# Patient Record
Sex: Female | Born: 1965 | Smoking: Never smoker
Health system: Southern US, Community
[De-identification: ages and names within clinical notes are randomized; demographics above are authoritative.]

## PROBLEM LIST (undated history)

## (undated) DIAGNOSIS — I471 Supraventricular tachycardia, unspecified: Secondary | ICD-10-CM

## (undated) DIAGNOSIS — T7840XA Allergy, unspecified, initial encounter: Secondary | ICD-10-CM

## (undated) DIAGNOSIS — F988 Other specified behavioral and emotional disorders with onset usually occurring in childhood and adolescence: Secondary | ICD-10-CM

## (undated) DIAGNOSIS — I10 Essential (primary) hypertension: Secondary | ICD-10-CM

## (undated) DIAGNOSIS — R002 Palpitations: Secondary | ICD-10-CM

## (undated) DIAGNOSIS — E7800 Pure hypercholesterolemia, unspecified: Secondary | ICD-10-CM

## (undated) DIAGNOSIS — F439 Reaction to severe stress, unspecified: Secondary | ICD-10-CM

## (undated) HISTORY — DX: Essential (primary) hypertension: I10

## (undated) HISTORY — DX: Other specified behavioral and emotional disorders with onset usually occurring in childhood and adolescence: F98.8

## (undated) HISTORY — DX: Reaction to severe stress, unspecified: F43.9

## (undated) HISTORY — DX: Supraventricular tachycardia: I47.1

## (undated) HISTORY — DX: Pure hypercholesterolemia, unspecified: E78.00

## (undated) HISTORY — PX: BREAST BIOPSY: SHX20

## (undated) HISTORY — DX: Palpitations: R00.2

## (undated) HISTORY — DX: Allergy, unspecified, initial encounter: T78.40XA

## (undated) HISTORY — DX: Supraventricular tachycardia, unspecified: I47.10

## (undated) HISTORY — PX: BREAST EXCISIONAL BIOPSY: SUR124

---

## 2014-04-21 ENCOUNTER — Other Ambulatory Visit (HOSPITAL_COMMUNITY)
Admission: RE | Admit: 2014-04-21 | Discharge: 2014-04-21 | Disposition: A | Discharge status: Home / Self Care | Payer: BC Managed Care – PPO | Source: Ambulatory Visit | Attending: Family Medicine | Admitting: Family Medicine

## 2014-04-21 DIAGNOSIS — Z01419 Encounter for gynecological examination (general) (routine) without abnormal findings: Secondary | ICD-10-CM | POA: Insufficient documentation

## 2015-12-02 ENCOUNTER — Other Ambulatory Visit: Payer: Self-pay | Admitting: Family Medicine

## 2015-12-02 DIAGNOSIS — Z1231 Encounter for screening mammogram for malignant neoplasm of breast: Secondary | ICD-10-CM

## 2015-12-03 ENCOUNTER — Ambulatory Visit
Admission: RE | Admit: 2015-12-03 | Discharge: 2015-12-03 | Disposition: A | Discharge status: Home / Self Care | Payer: BC Managed Care – PPO | Source: Ambulatory Visit | Attending: Family Medicine | Admitting: Family Medicine

## 2015-12-03 DIAGNOSIS — Z1231 Encounter for screening mammogram for malignant neoplasm of breast: Secondary | ICD-10-CM

## 2019-01-01 ENCOUNTER — Other Ambulatory Visit: Payer: Self-pay | Admitting: Family Medicine

## 2019-01-01 DIAGNOSIS — Z1231 Encounter for screening mammogram for malignant neoplasm of breast: Secondary | ICD-10-CM

## 2019-01-04 ENCOUNTER — Ambulatory Visit: Payer: BC Managed Care – PPO

## 2019-01-30 ENCOUNTER — Other Ambulatory Visit: Payer: Self-pay

## 2019-01-30 ENCOUNTER — Ambulatory Visit
Admission: RE | Admit: 2019-01-30 | Discharge: 2019-01-30 | Disposition: A | Discharge status: Home / Self Care | Payer: BC Managed Care – PPO | Source: Ambulatory Visit | Attending: Family Medicine | Admitting: Family Medicine

## 2019-01-30 DIAGNOSIS — Z1231 Encounter for screening mammogram for malignant neoplasm of breast: Secondary | ICD-10-CM

## 2019-01-31 ENCOUNTER — Other Ambulatory Visit: Payer: Self-pay | Admitting: Family Medicine

## 2019-01-31 DIAGNOSIS — R928 Other abnormal and inconclusive findings on diagnostic imaging of breast: Secondary | ICD-10-CM

## 2019-02-11 ENCOUNTER — Other Ambulatory Visit: Payer: Self-pay

## 2019-02-11 ENCOUNTER — Ambulatory Visit: Payer: BC Managed Care – PPO

## 2019-02-11 ENCOUNTER — Ambulatory Visit
Admission: RE | Admit: 2019-02-11 | Discharge: 2019-02-11 | Disposition: A | Discharge status: Home / Self Care | Payer: BC Managed Care – PPO | Source: Ambulatory Visit | Attending: Family Medicine | Admitting: Family Medicine

## 2019-02-11 ENCOUNTER — Other Ambulatory Visit: Payer: Self-pay | Admitting: Family Medicine

## 2019-02-11 DIAGNOSIS — R921 Mammographic calcification found on diagnostic imaging of breast: Secondary | ICD-10-CM

## 2019-02-11 DIAGNOSIS — R928 Other abnormal and inconclusive findings on diagnostic imaging of breast: Secondary | ICD-10-CM

## 2019-02-18 ENCOUNTER — Other Ambulatory Visit (HOSPITAL_COMMUNITY): Payer: Self-pay | Admitting: Diagnostic Radiology

## 2019-02-18 ENCOUNTER — Other Ambulatory Visit: Payer: Self-pay

## 2019-02-18 ENCOUNTER — Ambulatory Visit
Admission: RE | Admit: 2019-02-18 | Discharge: 2019-02-18 | Disposition: A | Discharge status: Home / Self Care | Payer: BC Managed Care – PPO | Source: Ambulatory Visit | Attending: Family Medicine | Admitting: Family Medicine

## 2019-02-18 DIAGNOSIS — R921 Mammographic calcification found on diagnostic imaging of breast: Secondary | ICD-10-CM

## 2019-02-27 ENCOUNTER — Emergency Department (HOSPITAL_COMMUNITY)
Admission: EM | Admit: 2019-02-27 | Discharge: 2019-02-27 | Disposition: A | Discharge status: Home / Self Care | Payer: BC Managed Care – PPO | Attending: Emergency Medicine | Admitting: Emergency Medicine

## 2019-02-27 ENCOUNTER — Encounter (HOSPITAL_COMMUNITY): Payer: Self-pay | Admitting: Emergency Medicine

## 2019-02-27 DIAGNOSIS — I471 Supraventricular tachycardia: Secondary | ICD-10-CM | POA: Insufficient documentation

## 2019-02-27 DIAGNOSIS — Z79899 Other long term (current) drug therapy: Secondary | ICD-10-CM | POA: Diagnosis not present

## 2019-02-27 LAB — CBC WITH DIFFERENTIAL/PLATELET
Abs Immature Granulocytes: 0.01 10*3/uL (ref 0.00–0.07)
Basophils Absolute: 0.1 10*3/uL (ref 0.0–0.1)
Basophils Relative: 1 %
Eosinophils Absolute: 0.1 10*3/uL (ref 0.0–0.5)
Eosinophils Relative: 1 %
HCT: 41.2 % (ref 36.0–46.0)
Hemoglobin: 14.2 g/dL (ref 12.0–15.0)
Immature Granulocytes: 0 %
Lymphocytes Relative: 34 %
Lymphs Abs: 2 10*3/uL (ref 0.7–4.0)
MCH: 30.7 pg (ref 26.0–34.0)
MCHC: 34.5 g/dL (ref 30.0–36.0)
MCV: 89 fL (ref 80.0–100.0)
Monocytes Absolute: 0.5 10*3/uL (ref 0.1–1.0)
Monocytes Relative: 8 %
Neutro Abs: 3.3 10*3/uL (ref 1.7–7.7)
Neutrophils Relative %: 56 %
Platelets: 395 10*3/uL (ref 150–400)
RBC: 4.63 MIL/uL (ref 3.87–5.11)
RDW: 11.9 % (ref 11.5–15.5)
WBC: 5.9 10*3/uL (ref 4.0–10.5)
nRBC: 0 % (ref 0.0–0.2)

## 2019-02-27 LAB — COMPREHENSIVE METABOLIC PANEL
ALT: 24 U/L (ref 0–44)
AST: 26 U/L (ref 15–41)
Albumin: 4 g/dL (ref 3.5–5.0)
Alkaline Phosphatase: 51 U/L (ref 38–126)
Anion gap: 12 (ref 5–15)
BUN: 9 mg/dL (ref 6–20)
CO2: 23 mmol/L (ref 22–32)
Calcium: 9 mg/dL (ref 8.9–10.3)
Chloride: 103 mmol/L (ref 98–111)
Creatinine, Ser: 0.82 mg/dL (ref 0.44–1.00)
GFR calc Af Amer: >60 mL/min (ref >60)
GFR calc non Af Amer: >60 mL/min (ref >60)
Glucose, Bld: 113 mg/dL — ABNORMAL HIGH (ref 70–99)
Potassium: 3.4 mmol/L — ABNORMAL LOW (ref 3.5–5.1)
Sodium: 138 mmol/L (ref 135–145)
Total Bilirubin: 0.6 mg/dL (ref 0.3–1.2)
Total Protein: 7.1 g/dL (ref 6.5–8.1)

## 2019-02-27 LAB — MAGNESIUM: Magnesium: 2 mg/dL (ref 1.7–2.4)

## 2019-02-27 LAB — TROPONIN I (HIGH SENSITIVITY): Troponin I (High Sensitivity): 37 ng/L — ABNORMAL HIGH (ref <18)

## 2019-02-27 LAB — TSH: TSH: 3.29 u[IU]/mL (ref 0.350–4.500)

## 2019-02-27 LAB — PHOSPHORUS: Phosphorus: 1.8 mg/dL — ABNORMAL LOW (ref 2.5–4.6)

## 2019-02-27 MED ORDER — SODIUM CHLORIDE 0.9 % IV BOLUS
1000.0000 mL | Freq: Once | INTRAVENOUS | Status: AC
  Administered 2019-02-27: 1000 mL via INTRAVENOUS

## 2019-02-27 MED ORDER — K PHOS MONO-SOD PHOS DI & MONO 155-852-130 MG PO TABS: 500.0000 mg | ORAL_TABLET | Freq: Once | ORAL | Status: DC

## 2019-02-27 MED ORDER — K PHOS MONO-SOD PHOS DI & MONO 155-852-130 MG PO TABS
500.0000 mg | ORAL_TABLET | Freq: Once | ORAL | Status: AC
  Administered 2019-02-27: 500 mg via ORAL
  Filled 2019-02-27: qty 2

## 2019-02-27 MED ORDER — METOPROLOL TARTRATE 25 MG PO TABS: 50.0000 mg | ORAL_TABLET | Freq: Once | ORAL | Status: DC

## 2019-02-27 MED ORDER — K-PHOS 500 MG PO TABS: 500.0000 mg | ORAL_TABLET | Freq: Three times a day (TID) | ORAL | 0 refills | Status: DC

## 2019-02-27 MED ORDER — POTASSIUM PHOSPHATES 15 MMOLE/5ML IV SOLN
21.0000 mmol | Freq: Once | INTRAVENOUS | Status: DC
  Filled 2019-02-27: qty 7

## 2019-02-27 MED ORDER — METOPROLOL TARTRATE 25 MG PO TABS
25.0000 mg | ORAL_TABLET | Freq: Once | ORAL | Status: AC
  Administered 2019-02-27: 19:00:00 25 mg via ORAL
  Filled 2019-02-27: qty 1

## 2019-02-27 MED ORDER — POTASSIUM PHOSPHATE MONOBASIC 500 MG PO TABS
500.0000 mg | ORAL_TABLET | Freq: Once | ORAL | Status: DC
  Filled 2019-02-27: qty 1

## 2019-02-27 MED ORDER — METOPROLOL SUCCINATE ER 25 MG PO TB24: 25.0000 mg | ORAL_TABLET | Freq: Every day | ORAL | 0 refills | Status: DC

## 2019-02-27 NOTE — Discharge Instructions (Addendum)
You went into SVT likely because your phosphorous level is slightly low.  Please take the phosphorus pills as prescribed   See cardiology in a week for follow up. Consider repeating phosphorous levels in a week   If your heart rate is elevated, try vagal maneuvers and then metoprolol 25 mg daily as needed. If that doesn't help, come to the ED   Return to ER if you have recurrent SVT, shortness of breath, chest pain

## 2019-02-27 NOTE — ED Triage Notes (Signed)
Pt here from home with c/o svt history of same , was able to convert herself on Monday but was unable to convert today received 6mg  adenosine by ems and converted to st

## 2019-02-27 NOTE — ED Notes (Signed)
Patient verbalizes understanding of discharge instructions. Opportunity for questioning and answers were provided. Armband removed by staff, pt discharged from ED ambulatory w/ husband  

## 2019-02-27 NOTE — ED Provider Notes (Signed)
[facility] EMERGENCY DEPARTMENT Provider Note   CSN: [id number] Arrival date & time: 02/27/19  1706     History No chief complaint on file.   Beth Pearson is a 54 y.o. female here presenting with SVT.  Patient has a history of SVT and had seen cardiology previously.  She states that 2 days ago, she noticed that she had palpitations and felt that she is in SVT so tried some vagal maneuvers and it helped.  She states that today her palpitations came back and she tried vagal maneuvers but did not help .  She then called EMS who noticed that she was in SVT on the twelve-lead EKG and gave her 6 mg of adenosine and converted back to sinus .  Patient states that she has been very anxious recently.  She denies any weight loss. She denies any history of thyroid problems.She denies any chest pain or shortness of breath.  Denies any recent travel history of blood clots   The history is provided by the patient.       History reviewed. No pertinent past medical history.  There are no problems to display for this patient.   History reviewed. No pertinent surgical history.   OB History   No obstetric history on file.     History reviewed. No pertinent family history.  Social History   Tobacco Use  . Smoking status: Never Smoker  . Smokeless tobacco: Never Used  Substance Use Topics  . Alcohol use: Yes    Comment: social   . Drug use: Not on file    Home Medications Prior to Admission medications   Medication Sig Start Date End Date Taking? Authorizing Provider  Cholecalciferol (VITAMIN D-3 PO) Take 1 capsule by mouth daily with breakfast.   Yes [provider]  escitalopram (LEXAPRO) 5 MG tablet Take 5 mg by mouth daily. 02/12/19  Yes [provider]  Multiple Vitamins-Calcium (ONE-A-DAY WOMENS FORMULA) TABS Take 1 tablet by mouth daily with breakfast.   Yes [provider]  Omega-3 Fatty Acids (FISH OIL PO) Take 1 capsule by mouth  daily with breakfast.   Yes [provider]  VYVANSE 40 MG capsule Take 40 mg by mouth every morning.  02/21/19  Yes [provider]    Allergies    Penicillins  Review of Systems   Review of Systems  Cardiovascular: Positive for palpitations.  All other systems reviewed and are negative.   Physical Exam Updated Vital Signs BP (!) 143/91   Pulse 92   Temp 98.3 F (36.8 C) (Temporal)   Resp 18   SpO2 94%   Physical Exam Vitals and nursing note reviewed.  HENT:     Head: Normocephalic.     Nose: Nose normal.     Mouth/Throat:     Mouth: Mucous membranes are moist.  Eyes:     Extraocular Movements: Extraocular movements intact.     Pupils: Pupils are equal, round, and reactive to light.  Cardiovascular:     Rate and Rhythm: Regular rhythm. Tachycardia present.  Pulmonary:     Effort: Pulmonary effort is normal.     Breath sounds: Normal breath sounds.  Abdominal:     General: Abdomen is flat.     Palpations: Abdomen is soft.  Musculoskeletal:        General: Normal range of motion.     Cervical back: Normal range of motion.  Skin:    General: Skin is warm.  Capillary Refill: Capillary refill takes less than 2 seconds.  Neurological:     General: No focal deficit present.     Mental Status: She is alert and oriented to person, place, and time.  Psychiatric:        Mood and Affect: Mood normal.        Behavior: Behavior normal.     ED Results / Procedures / Treatments   Labs (all labs ordered are listed, but only abnormal results are displayed) Labs Reviewed  COMPREHENSIVE METABOLIC PANEL - Abnormal; Notable for the following components:      Result Value   Potassium 3.4 (*)    Glucose, Bld 113 (*)    All other components within normal limits  PHOSPHORUS - Abnormal; Notable for the following components:   Phosphorus 1.8 (*)    All other components within normal limits  TROPONIN I (HIGH SENSITIVITY) - Abnormal; Notable for the following  components:   Troponin I (High Sensitivity) 37 (*)    All other components within normal limits  CBC WITH DIFFERENTIAL/PLATELET  MAGNESIUM  TSH    EKG EKG Interpretation  Date/Time:  Wednesday February 27 2019 17:20:43 EST Ventricular Rate:  102 PR Interval:    QRS Duration: 91 QT Interval:  352 QTC Calculation: 459 R Axis:   80 Text Interpretation: Sinus tachycardia Minimal ST depression, inferior leads No previous ECGs available Confirmed by Wandra Arthurs [phone]) on 02/27/2019 5:57:05 PM   Radiology No results found.  Procedures Procedures (including critical care time)  Medications Ordered in ED Medications  phosphorus (K PHOS NEUTRAL) tablet 500 mg (has no administration in time range)  sodium chloride 0.9 % bolus 1,000 mL (0 mLs Intravenous Stopped 02/27/19 1822)  metoprolol tartrate (LOPRESSOR) tablet 25 mg (25 mg Oral Given 02/27/19 1907)    ED Course  I have reviewed the triage vital signs and the nursing notes.  Pertinent labs & imaging results that were available during my care of the patient were reviewed by me and considered in my medical decision making (see chart for details).    MDM Rules/Calculators/A&P                      Beth Timmins is a 54 y.o. female here presenting with SVT.  It was converted by EMS and now she is back in sinus rhythm. Consider electrolyte abnormality versus hyperthyroidism.  She has no history of blood clots and I doubt ACS.  We will check electrolytes and TSH and hydrate patient.   7:25 PM Patient's labs showed phos of 1.8. Pharmacy recommend Kphos 500 mg x 4 doses. Given first dose in the ED. Trop 37 likely demand ischemia from tachycardia. HR in the 90s now.  Will discharge home with metoprolol as needed.  Since she had 2 episodes of SVT in a week I do recommend that she follows back up with cardiology.  Final Clinical Impression(s) / ED Diagnoses Final diagnoses:  None    Rx / DC Orders ED Discharge Orders    None        Drenda Freeze, MD 02/27/19 [phone]

## 2019-03-05 NOTE — Progress Notes (Signed)
Cardiology Office Consult Note    Date:  03/06/2019   ID:  Beth Pearson, DOB [birth year], MRN [id number]  PCP:  Carol Ada, MD  Cardiologist:  Fransico Him, MD   Chief Complaint  Patient presents with  . Follow-up    SVT  . New Patient (Initial Visit)    History of Present Illness:  Beth Pearson is a 54 y.o. female who is being seen today for the evaluation of SVT at the request of Carol Ada, MD.  This is a 54yo female with a hx of SVT who presented to [facility] ER with palpitations.  Apparently 2 days prior to to ER visit she developed palpitations similar to what she had with her SVT in the past.  She tried vagal maneuvers and terminated it.  She then developed palpitations again 2 days later and vagal maneuvers did not help and she called EMS and she was in SVT and give Adenosine and converted to NSR.  She was asymptomatic otherwise. Her phos was low at 1.8 and was repleted.  Trop was elevated at 37 and was felt to be demand ischemia from the tachycardia.  She was started on PRN BB.  She is now referred to cardiology.    She tells me that she has had a lot of stressors in her life recently.  She was placed on Lexapro 5mg  daily recently and is worried that it may have triggered her SVT that has been fairly quiescent lately.  She drinks 1 cup of caffeine in the am and 1-2 glasses of red wine at night. There is no hx of OTC cold meds recently but her SVT has been triggered by pseudophed in the past.    Past Medical History:  Diagnosis Date  . ADD (attention deficit disorder)   . Allergies   . Elevated LDL cholesterol level   . Hypertension   . Palpitations   . Situational stress   . SVT (supraventricular tachycardia) (HCC)     No past surgical history on file.  Current Medications: Current Meds  Medication Sig  . Biotin 5 MG CAPS Take by mouth daily.  . Cholecalciferol (VITAMIN D-3 PO) Take 1 capsule by mouth daily with breakfast.  . Multiple Vitamins-Calcium  (ONE-A-DAY WOMENS FORMULA) TABS Take 1 tablet by mouth daily with breakfast.  . Omega-3 Fatty Acids (FISH OIL PO) Take 1 capsule by mouth daily with breakfast.  . VYVANSE 40 MG capsule Take 40 mg by mouth every morning.     Allergies:   Penicillins   Social History   Socioeconomic History  . Marital status: Unknown    Spouse name: Not on file  . Number of children: Not on file  . Years of education: Not on file  . Highest education level: Not on file  Occupational History  . Not on file  Tobacco Use  . Smoking status: Never Smoker  . Smokeless tobacco: Never Used  Substance and Sexual Activity  . Alcohol use: Yes    Comment: social   . Drug use: Not on file  . Sexual activity: Not on file  Other Topics Concern  . Not on file  Social History Narrative  . Not on file   Social Determinants of Health   Financial Resource Strain:   . Difficulty of Paying Living Expenses: Not on file  Food Insecurity:   . Worried About Charity fundraiser in the Last Year: Not on file  . Ran Out of Food in the Last Year:  Not on file  Transportation Needs:   . Lack of Transportation (Medical): Not on file  . Lack of Transportation (Non-Medical): Not on file  Physical Activity:   . Days of Exercise per Week: Not on file  . Minutes of Exercise per Session: Not on file  Stress:   . Feeling of Stress : Not on file  Social Connections:   . Frequency of Communication with Friends and Family: Not on file  . Frequency of Social Gatherings with Friends and Family: Not on file  . Attends Religious Services: Not on file  . Active Member of Clubs or Organizations: Not on file  . Attends Banker Meetings: Not on file  . Marital Status: Not on file     Family History:  The patient's family history includes Parkinson's disease in her mother.   ROS:   Please see the history of present illness.    ROS All other systems reviewed and are negative.  No flowsheet data  found.     PHYSICAL EXAM:   VS:  BP (!) 140/92   Pulse 92   Ht 5\' 2"  (1.575 m)   Wt 181 lb (82.1 kg)   SpO2 98%   BMI 33.11 kg/m    GEN: Well nourished, well developed, in no acute distress  HEENT: normal  Neck: no JVD, carotid bruits, or masses Cardiac: RRR; no murmurs, rubs, or gallops,no edema.  Intact distal pulses bilaterally.  Respiratory:  clear to auscultation bilaterally, normal work of breathing GI: soft, nontender, nondistended, + BS MS: no deformity or atrophy  Skin: warm and dry, no rash Neuro:  Alert and Oriented x 3, Strength and sensation are intact Psych: euthymic mood, full affect  Wt Readings from Last 3 Encounters:  03/06/19 181 lb (82.1 kg)      Studies/Labs Reviewed:   EKG:  EKG is not ordered today.   Recent Labs: 02/27/2019: ALT 24; BUN 9; Creatinine, Ser 0.82; Hemoglobin 14.2; Magnesium 2.0; Platelets 395; Potassium 3.4; Sodium 138; TSH 3.290   Lipid Panel No results found for: CHOL, TRIG, HDL, CHOLHDL, VLDL, LDLCALC, LDLDIRECT  Additional studies/ records that were reviewed today include:  Outside labs from PCP on KPN, EKG in ER and ER visit notes    ASSESSMENT:    1. SVT (supraventricular tachycardia) (HCC)   2. Elevated troponin   3. Hypophosphatemia      PLAN:  In order of problems listed above:  1. SVT -she has had a hx of this in the past -vagal maneuvers usually terminate these -she has had a lot of stress recently and is concerned that recent initiation of Lexapro may have triggered her palpitations. -Her Phos was low in ER and repleted -continue PRN lopressor -we discussed starting vagal maneuvers sooner if she develops recurrent SVT -we also discussed possible ablation if these episodes become more frequent -encouraged her to decrease ETOH intake and avoid too much caffeine -she is now off Lexapro  2.  Elevated trop -hsTrop was 37 at time of SVT and felt to be related to demand ischemia from the tachycardia -she  denies any anginal sx -check 2D echo to assess LVF  3.  Hypophosphatemia -? Etiology -will check BMET, Ca, Mag and Phos today    Medication Adjustments/Labs and Tests Ordered: Current medicines are reviewed at length with the patient today.  Concerns regarding medicines are outlined above.  Medication changes, Labs and Tests ordered today are listed in the Patient Instructions below.  Patient Instructions  Medication Instructions:  Your physician recommends that you continue on your current medications as directed. Please refer to the Current Medication list given to you today.  *If you need a refill on your cardiac medications before your next appointment, please call your pharmacy*  Lab Work: TODAY: BMET, Mg, Phosphorus, Calcium If you have labs (blood work) drawn today and your tests are completely normal, you will receive your results only by: Marland Kitchen MyChart Message (if you have MyChart) OR . A paper copy in the mail If you have any lab test that is abnormal or we need to change your treatment, we will call you to review the results.  Testing/Procedures: Your physician has requested that you have an echocardiogram. Echocardiography is a painless test that uses sound waves to create images of your heart. It provides your doctor with information about the size and shape of your heart and how well your heart's chambers and valves are working. This procedure takes approximately one hour. There are no restrictions for this procedure.  Follow-Up: At [facility], you and your health needs are our priority.  As part of our continuing mission to provide you with exceptional heart care, we have created designated Provider Care Teams.  These Care Teams include your primary Cardiologist (physician) and Advanced Practice Providers (APPs -  Physician Assistants and Nurse Practitioners) who all work together to provide you with the care you need, when you need it.  Your next appointment:   6  month(s)  The format for your next appointment:   In Person  Provider:   Armanda Magic, MD      Signed, Armanda Magic, MD  03/06/2019 3:19 PM    [facility] [address] [place], [place], [place]  [address] Phone: [phone]; Fax: [phone]

## 2019-03-06 ENCOUNTER — Other Ambulatory Visit: Payer: Self-pay

## 2019-03-06 ENCOUNTER — Encounter: Payer: Self-pay | Admitting: Cardiology

## 2019-03-06 ENCOUNTER — Ambulatory Visit: Payer: BC Managed Care – PPO | Admitting: Cardiology

## 2019-03-06 VITALS — BP 140/92 | HR 92 | Ht 62.0 in | Wt 181.0 lb

## 2019-03-06 DIAGNOSIS — R778 Other specified abnormalities of plasma proteins: Secondary | ICD-10-CM | POA: Diagnosis not present

## 2019-03-06 DIAGNOSIS — I471 Supraventricular tachycardia, unspecified: Secondary | ICD-10-CM

## 2019-03-06 DIAGNOSIS — R7989 Other specified abnormal findings of blood chemistry: Secondary | ICD-10-CM

## 2019-03-06 NOTE — Patient Instructions (Signed)
Medication Instructions:  Your physician recommends that you continue on your current medications as directed. Please refer to the Current Medication list given to you today.  *If you need a refill on your cardiac medications before your next appointment, please call your pharmacy*  Lab Work: TODAY: BMET, Mg, Phosphorus, Calcium If you have labs (blood work) drawn today and your tests are completely normal, you will receive your results only by: Marland Kitchen MyChart Message (if you have MyChart) OR . A paper copy in the mail If you have any lab test that is abnormal or we need to change your treatment, we will call you to review the results.  Testing/Procedures: Your physician has requested that you have an echocardiogram. Echocardiography is a painless test that uses sound waves to create images of your heart. It provides your doctor with information about the size and shape of your heart and how well your heart's chambers and valves are working. This procedure takes approximately one hour. There are no restrictions for this procedure.  Follow-Up: At [facility], you and your health needs are our priority.  As part of our continuing mission to provide you with exceptional heart care, we have created designated Provider Care Teams.  These Care Teams include your primary Cardiologist (physician) and Advanced Practice Providers (APPs -  Physician Assistants and Nurse Practitioners) who all work together to provide you with the care you need, when you need it.  Your next appointment:   6 month(s)  The format for your next appointment:   In Person  Provider:   Armanda Magic, MD

## 2019-03-07 LAB — BASIC METABOLIC PANEL
BUN/Creatinine Ratio: 18 (ref 9–23)
BUN: 14 mg/dL (ref 6–24)
CO2: 26 mmol/L (ref 20–29)
Calcium: 8.8 mg/dL (ref 8.7–10.2)
Chloride: 102 mmol/L (ref 96–106)
Creatinine, Ser: 0.78 mg/dL (ref 0.57–1.00)
GFR calc Af Amer: 100 mL/min/{1.73_m2} (ref >59)
GFR calc non Af Amer: 87 mL/min/{1.73_m2} (ref >59)
Glucose: 97 mg/dL (ref 65–99)
Potassium: 4.2 mmol/L (ref 3.5–5.2)
Sodium: 143 mmol/L (ref 134–144)

## 2019-03-07 LAB — MAGNESIUM: Magnesium: 2.3 mg/dL (ref 1.6–2.3)

## 2019-03-07 LAB — PHOSPHORUS: Phosphorus: 3.8 mg/dL (ref 3.0–4.3)

## 2019-03-20 ENCOUNTER — Other Ambulatory Visit: Payer: Self-pay

## 2019-03-20 ENCOUNTER — Ambulatory Visit (HOSPITAL_COMMUNITY): Payer: BC Managed Care – PPO | Attending: Cardiovascular Disease

## 2019-03-20 DIAGNOSIS — I471 Supraventricular tachycardia: Secondary | ICD-10-CM | POA: Diagnosis not present

## 2019-03-20 DIAGNOSIS — R778 Other specified abnormalities of plasma proteins: Secondary | ICD-10-CM | POA: Diagnosis present

## 2019-03-26 ENCOUNTER — Telehealth: Payer: Self-pay | Admitting: Cardiology

## 2019-03-26 NOTE — Telephone Encounter (Signed)
The patient has been notified of the result and verbalized understanding.  All questions (if any) were answered. Theresia Majors, RN 03/26/2019 10:46 AM

## 2019-03-26 NOTE — Telephone Encounter (Signed)
Patient returning call for echo results. 

## 2019-08-09 ENCOUNTER — Other Ambulatory Visit: Payer: Self-pay | Admitting: Family Medicine

## 2019-08-09 DIAGNOSIS — R921 Mammographic calcification found on diagnostic imaging of breast: Secondary | ICD-10-CM

## 2019-09-02 ENCOUNTER — Other Ambulatory Visit: Payer: Self-pay

## 2019-09-02 ENCOUNTER — Other Ambulatory Visit: Payer: Self-pay | Admitting: Family Medicine

## 2019-09-02 ENCOUNTER — Ambulatory Visit
Admission: RE | Admit: 2019-09-02 | Discharge: 2019-09-02 | Disposition: A | Discharge status: Home / Self Care | Payer: BC Managed Care – PPO | Source: Ambulatory Visit | Attending: Family Medicine | Admitting: Family Medicine

## 2019-09-02 DIAGNOSIS — R921 Mammographic calcification found on diagnostic imaging of breast: Secondary | ICD-10-CM

## 2019-09-19 ENCOUNTER — Ambulatory Visit: Payer: BC Managed Care – PPO | Admitting: Cardiology

## 2019-09-19 ENCOUNTER — Encounter: Payer: Self-pay | Admitting: Cardiology

## 2019-09-19 ENCOUNTER — Other Ambulatory Visit: Payer: Self-pay

## 2019-09-19 VITALS — BP 134/78 | HR 85 | Ht 62.0 in | Wt 182.0 lb

## 2019-09-19 DIAGNOSIS — R778 Other specified abnormalities of plasma proteins: Secondary | ICD-10-CM | POA: Diagnosis not present

## 2019-09-19 DIAGNOSIS — I471 Supraventricular tachycardia: Secondary | ICD-10-CM | POA: Insufficient documentation

## 2019-09-19 NOTE — Patient Instructions (Signed)

## 2019-09-19 NOTE — Progress Notes (Signed)
Cardiology Office Note    Date:  09/19/2019   ID:  Beth Pearson, DOB [birth year], MRN [id number]  PCP:  Merri Brunette, MD  Cardiologist:  Armanda Magic, MD   Chief Complaint  Patient presents with  . Follow-up    SVY    History of Present Illness:  Beth Pearson is a 54 y.o. female with a hx of SVT who presented to [facility] ER with palpitations in Feb 2021.  Apparently 2 days prior to to ER visit she developed palpitations similar to what she had with her SVT in the past.  She tried vagal maneuvers and terminated it.  She then developed palpitations again 2 days later and vagal maneuvers did not help and she called EMS and she was in SVT and give Adenosine and converted to NSR.  She was asymptomatic otherwise. Her phos was low at 1.8 and was repleted.  Trop was elevated at 37 and was felt to be demand ischemia from the tachycardia.  She was started on PRN BB.  2D echo was done which showed normal LVF and mild LAE.  She is here today for followup and is doing well.  She has had a lot of anxiety over the past 10 months due to family stressors and had been crying a lot.  She is currently on medical leave to grieve the death of her mom.  Her Toprol was changed to Propranolol due to severe migraine HAs and anxiety. She has had some nausea on the propranolol.  She is feeling much better.  She denies any chest pain or pressure, SOB, DOE, PND, orthopnea, LE edema, dizziness or syncope. She has only an occasional flutter but nothing sustained.  She is compliant with her meds and is tolerating meds with no SE.     Past Medical History:  Diagnosis Date  . ADD (attention deficit disorder)   . Allergies   . Elevated LDL cholesterol level   . Hypertension   . Palpitations   . Situational stress   . SVT (supraventricular tachycardia) (HCC)     Past Surgical History:  Procedure Laterality Date  . BREAST BIOPSY Left   . BREAST EXCISIONAL BIOPSY Left    at age 77    Current Medications: No  outpatient medications have been marked as taking for the 09/19/19 encounter (Office Visit) with Quintella Reichert, MD.    Allergies:   Penicillins   Social History   Socioeconomic History  . Marital status: Unknown    Spouse name: Not on file  . Number of children: Not on file  . Years of education: Not on file  . Highest education level: Not on file  Occupational History  . Not on file  Tobacco Use  . Smoking status: Never Smoker  . Smokeless tobacco: Never Used  Substance and Sexual Activity  . Alcohol use: Yes    Comment: social   . Drug use: Not on file  . Sexual activity: Not on file  Other Topics Concern  . Not on file  Social History Narrative  . Not on file   Social Determinants of Health   Financial Resource Strain:   . Difficulty of Paying Living Expenses: Not on file  Food Insecurity:   . Worried About Programme researcher, broadcasting/film/video in the Last Year: Not on file  . Ran Out of Food in the Last Year: Not on file  Transportation Needs:   . Lack of Transportation (Medical): Not on file  . Lack of Transportation (  Non-Medical): Not on file  Physical Activity:   . Days of Exercise per Week: Not on file  . Minutes of Exercise per Session: Not on file  Stress:   . Feeling of Stress : Not on file  Social Connections:   . Frequency of Communication with Friends and Family: Not on file  . Frequency of Social Gatherings with Friends and Family: Not on file  . Attends Religious Services: Not on file  . Active Member of Clubs or Organizations: Not on file  . Attends Banker Meetings: Not on file  . Marital Status: Not on file     Family History:  The patient's family history includes Parkinson's disease in her mother.   ROS:   Please see the history of present illness.    ROS All other systems reviewed and are negative.  No flowsheet data found.  PHYSICAL EXAM:   VS:  There were no vitals taken for this visit.   GEN: Well nourished, well developed in no acute  distress HEENT: Normal NECK: No JVD; No carotid bruits LYMPHATICS: No lymphadenopathy CARDIAC:RRR, no murmurs, rubs, gallops RESPIRATORY:  Clear to auscultation without rales, wheezing or rhonchi  ABDOMEN: Soft, non-tender, non-distended MUSCULOSKELETAL:  No edema; No deformity  SKIN: Warm and dry NEUROLOGIC:  Alert and oriented x 3 PSYCHIATRIC:  Normal affect    Wt Readings from Last 3 Encounters:  03/06/19 181 lb (82.1 kg)     Studies/Labs Reviewed:   EKG:  EKG is not ordered today.   2D echo 03/2019 IMPRESSIONS   1. Left ventricular ejection fraction, by estimation, is 60 to 65%. Left  ventricular ejection fraction by PLAX is 63 %. The left ventricle has  normal function. The left ventricle has no regional wall motion  abnormalities. Left ventricular diastolic  parameters are consistent with Grade I diastolic dysfunction (impaired  relaxation).  2. Right ventricular systolic function is normal. The right ventricular  size is normal. There is mildly elevated pulmonary artery systolic  pressure.  3. Left atrial size was mildly dilated.  4. The mitral valve is normal in structure and function. Trivial mitral  valve regurgitation. No evidence of mitral stenosis.  5. The aortic valve is tricuspid. Aortic valve regurgitation is not  visualized. No aortic stenosis is present.  6. The inferior vena cava is normal in size with greater than 50%  respiratory variability, suggesting right atrial pressure of 3 mmHg.   Recent Labs: 02/27/2019: ALT 24; Hemoglobin 14.2; Platelets 395; TSH 3.290 03/06/2019: BUN 14; Creatinine, Ser 0.78; Magnesium 2.3; Potassium 4.2; Sodium 143   Lipid Panel No results found for: CHOL, TRIG, HDL, CHOLHDL, VLDL, LDLCALC, LDLDIRECT  Additional studies/ records that were reviewed today include:  Outside labs from PCP on KPN, EKG in ER and ER visit notes    ASSESSMENT:    1. SVT (supraventricular tachycardia) (HCC)   2. Elevated troponin       PLAN:  In order of problems listed above:  1. SVT -she has had a hx of this in the past -vagal maneuvers usually terminate these -last episode in Feb 2021 felt to be related to low Phos -she has not had any reoccurrence of palpitations -continue Propranolol but take at night instead to see if it helps with nausea -she knows to start vagal maneuvers sooner if she develops recurrent SVT -may need future ablation if these episodes become more frequent - avoid too much caffeine  2.  Elevated trop -hsTrop was 37 at time  of SVT and felt to be related to demand ischemia from the tachycardia -she denies any anginal sx -2D echo showed normal LVF   Medication Adjustments/Labs and Tests Ordered: Current medicines are reviewed at length with the patient today.  Concerns regarding medicines are outlined above.  Medication changes, Labs and Tests ordered today are listed in the Patient Instructions below.  There are no Patient Instructions on file for this visit.   Signed, Armanda Magic, MD  09/19/2019 1:26 PM    [facility] [address] [place], [place], [place]  [address] Phone: [phone]; Fax: [phone]

## 2020-04-21 ENCOUNTER — Other Ambulatory Visit: Payer: Self-pay | Admitting: Family Medicine

## 2020-04-21 ENCOUNTER — Other Ambulatory Visit (HOSPITAL_COMMUNITY)
Admission: RE | Admit: 2020-04-21 | Discharge: 2020-04-21 | Disposition: A | Discharge status: Home / Self Care | Payer: No Typology Code available for payment source | Source: Ambulatory Visit | Attending: Family Medicine | Admitting: Family Medicine

## 2020-04-21 DIAGNOSIS — Z124 Encounter for screening for malignant neoplasm of cervix: Secondary | ICD-10-CM | POA: Insufficient documentation

## 2020-04-23 LAB — CYTOLOGY - PAP
Adequacy: ABSENT
Comment: NEGATIVE
Diagnosis: NEGATIVE
High risk HPV: NEGATIVE

## 2021-01-27 ENCOUNTER — Other Ambulatory Visit: Payer: Self-pay | Admitting: Family Medicine

## 2021-01-27 DIAGNOSIS — R921 Mammographic calcification found on diagnostic imaging of breast: Secondary | ICD-10-CM

## 2021-02-24 ENCOUNTER — Ambulatory Visit
Admission: RE | Admit: 2021-02-24 | Discharge: 2021-02-24 | Disposition: A | Discharge status: Home / Self Care | Payer: No Typology Code available for payment source | Source: Ambulatory Visit | Attending: Family Medicine | Admitting: Family Medicine

## 2021-02-24 DIAGNOSIS — R921 Mammographic calcification found on diagnostic imaging of breast: Secondary | ICD-10-CM

## 2021-05-26 IMAGING — MG DIGITAL SCREENING BILAT W/ TOMO W/ CAD
8 series · 8 of 24 positions shown · non-contrast
Comparison: Previous exam(s).

CLINICAL DATA: Screening.

EXAM:
DIGITAL SCREENING BILATERAL MAMMOGRAM WITH TOMO AND CAD

[L CC synth-2D]
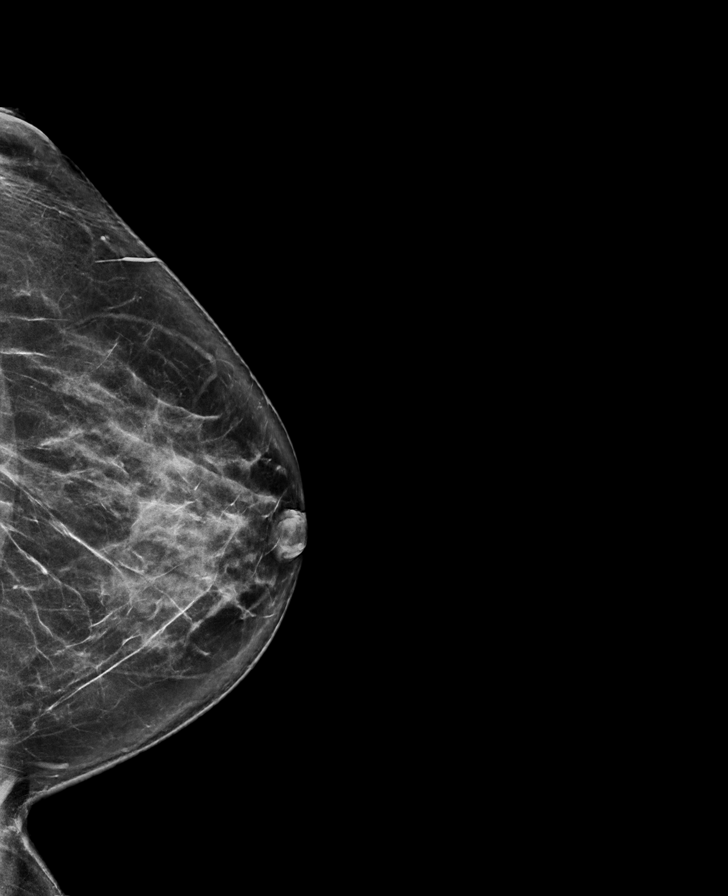

[L MLO synth-2D]
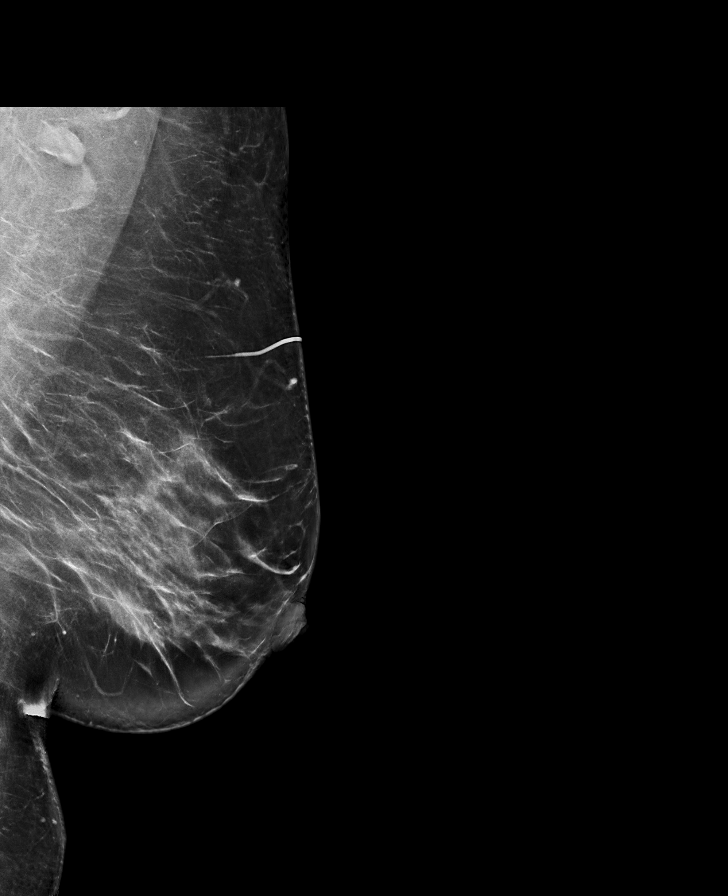

[R MLO synth-2D]
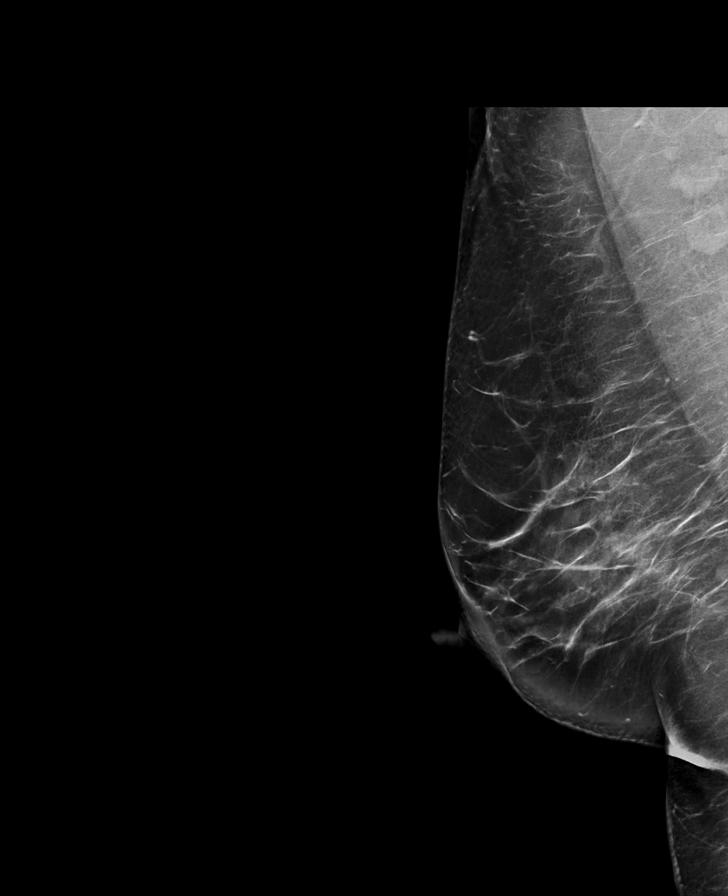

[R CC synth-2D]
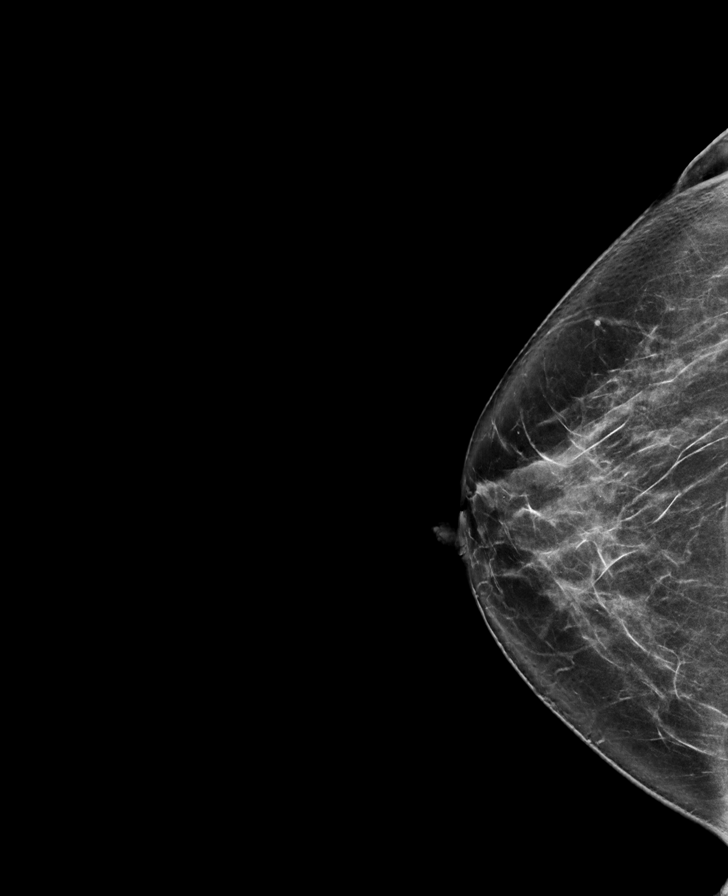

[L CC tomo · tomo slice 39/77.0]
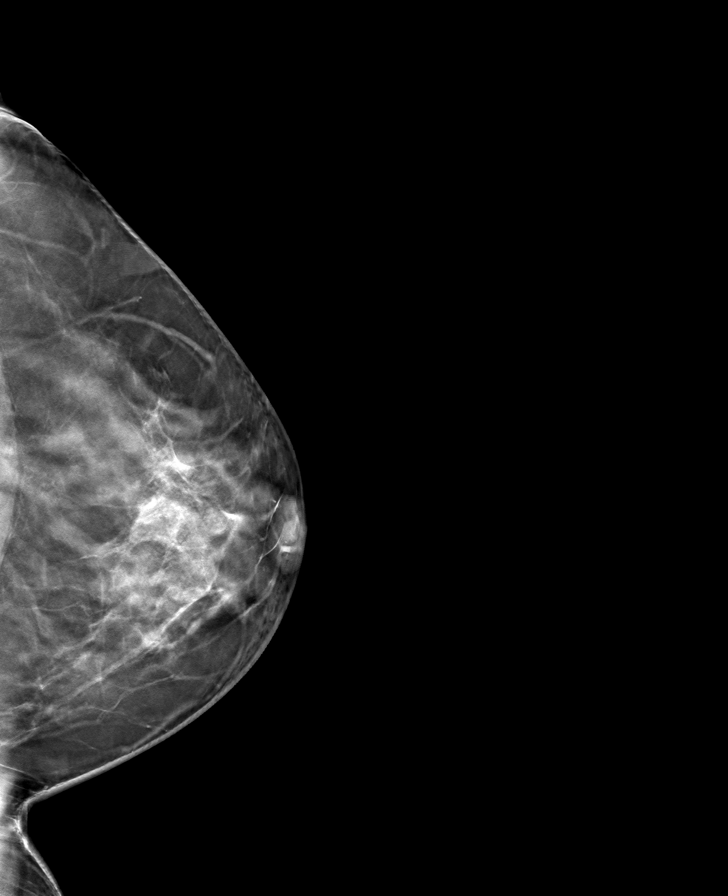

[R MLO tomo · tomo slice 47/92.0]
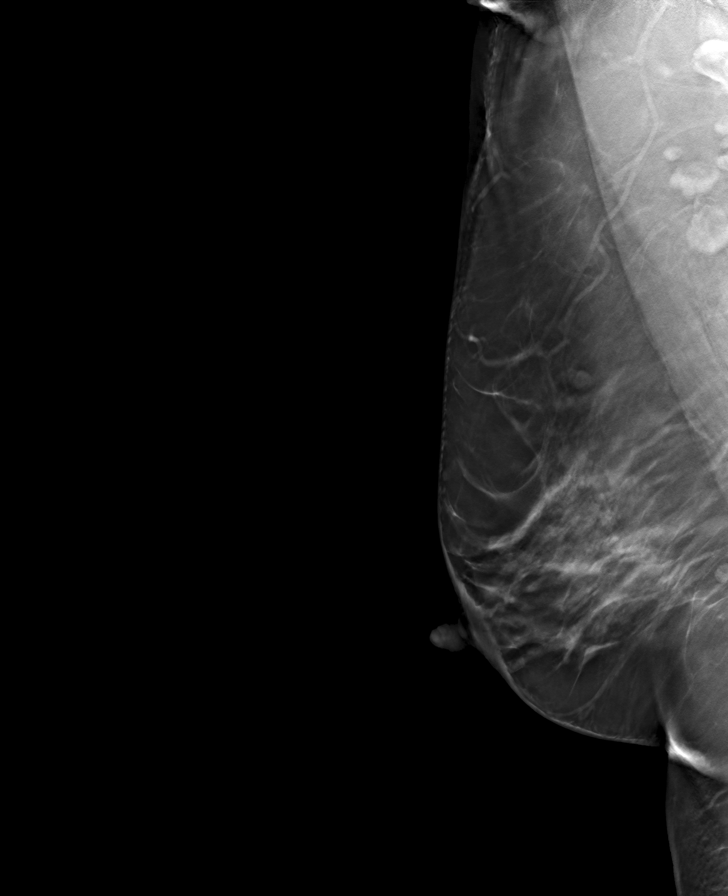

[L MLO tomo · tomo slice 48/95.0]
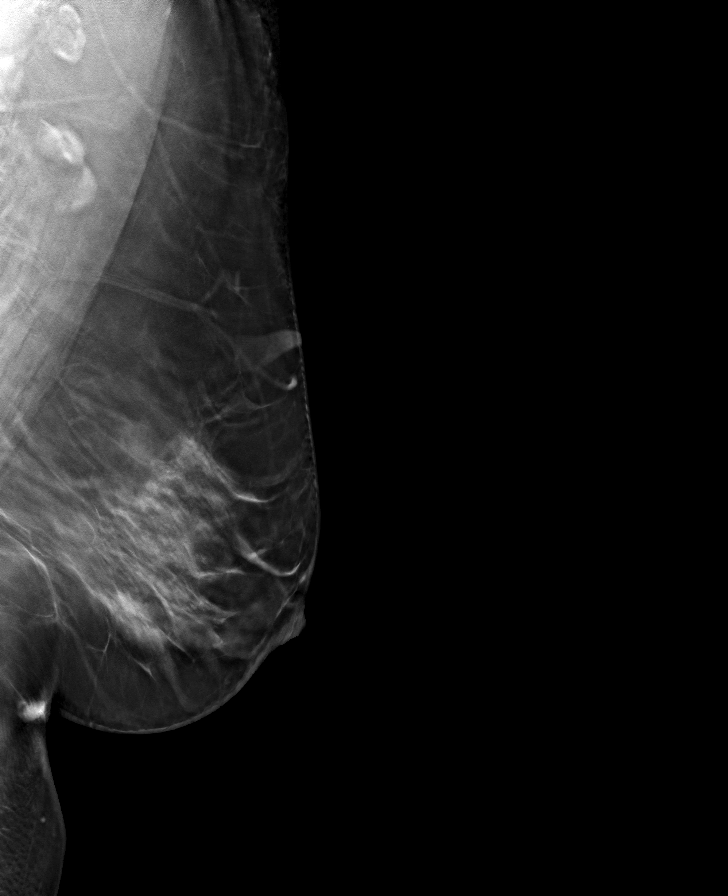

[R CC tomo · tomo slice 39/78.0]
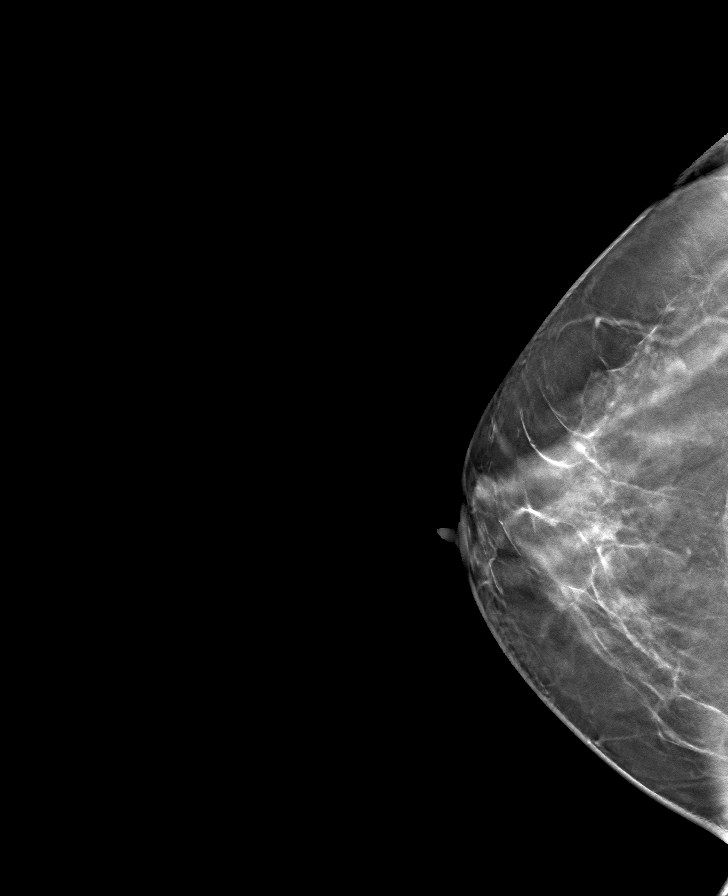

[8 of 24 positions shown; findings below may reference images not displayed]

ACR Breast Density Category c: The breast tissue is heterogeneously
dense, which may obscure small masses.
FINDINGS: In the left breast, a possible asymmetry warrants further
evaluation. In the right breast, no findings suspicious for
malignancy. Images were processed with CAD.
IMPRESSION: Further evaluation is suggested for possible asymmetry in the left
breast.

RECOMMENDATION:
Diagnostic mammogram and possibly ultrasound of the left breast.
(Code:[id number])

The patient will be contacted regarding the findings, and additional
imaging will be scheduled.

BI-RADS CATEGORY  0: Incomplete. Need additional imaging evaluation
and/or prior mammograms for comparison.

## 2021-06-14 IMAGING — MG MM BREAST LOCALIZATION CLIP
2 series · 3 of 6 positions shown · non-contrast
Comparison: Previous exam(s).

CLINICAL DATA: Status post stereotactic guided core needle biopsy
of an 8 mm group of calcifications in the upper inner quadrant of
the left breast.

EXAM:
DIAGNOSTIC LEFT MAMMOGRAM POST STEREOTACTIC BIOPSY

[L ML synth-2D]
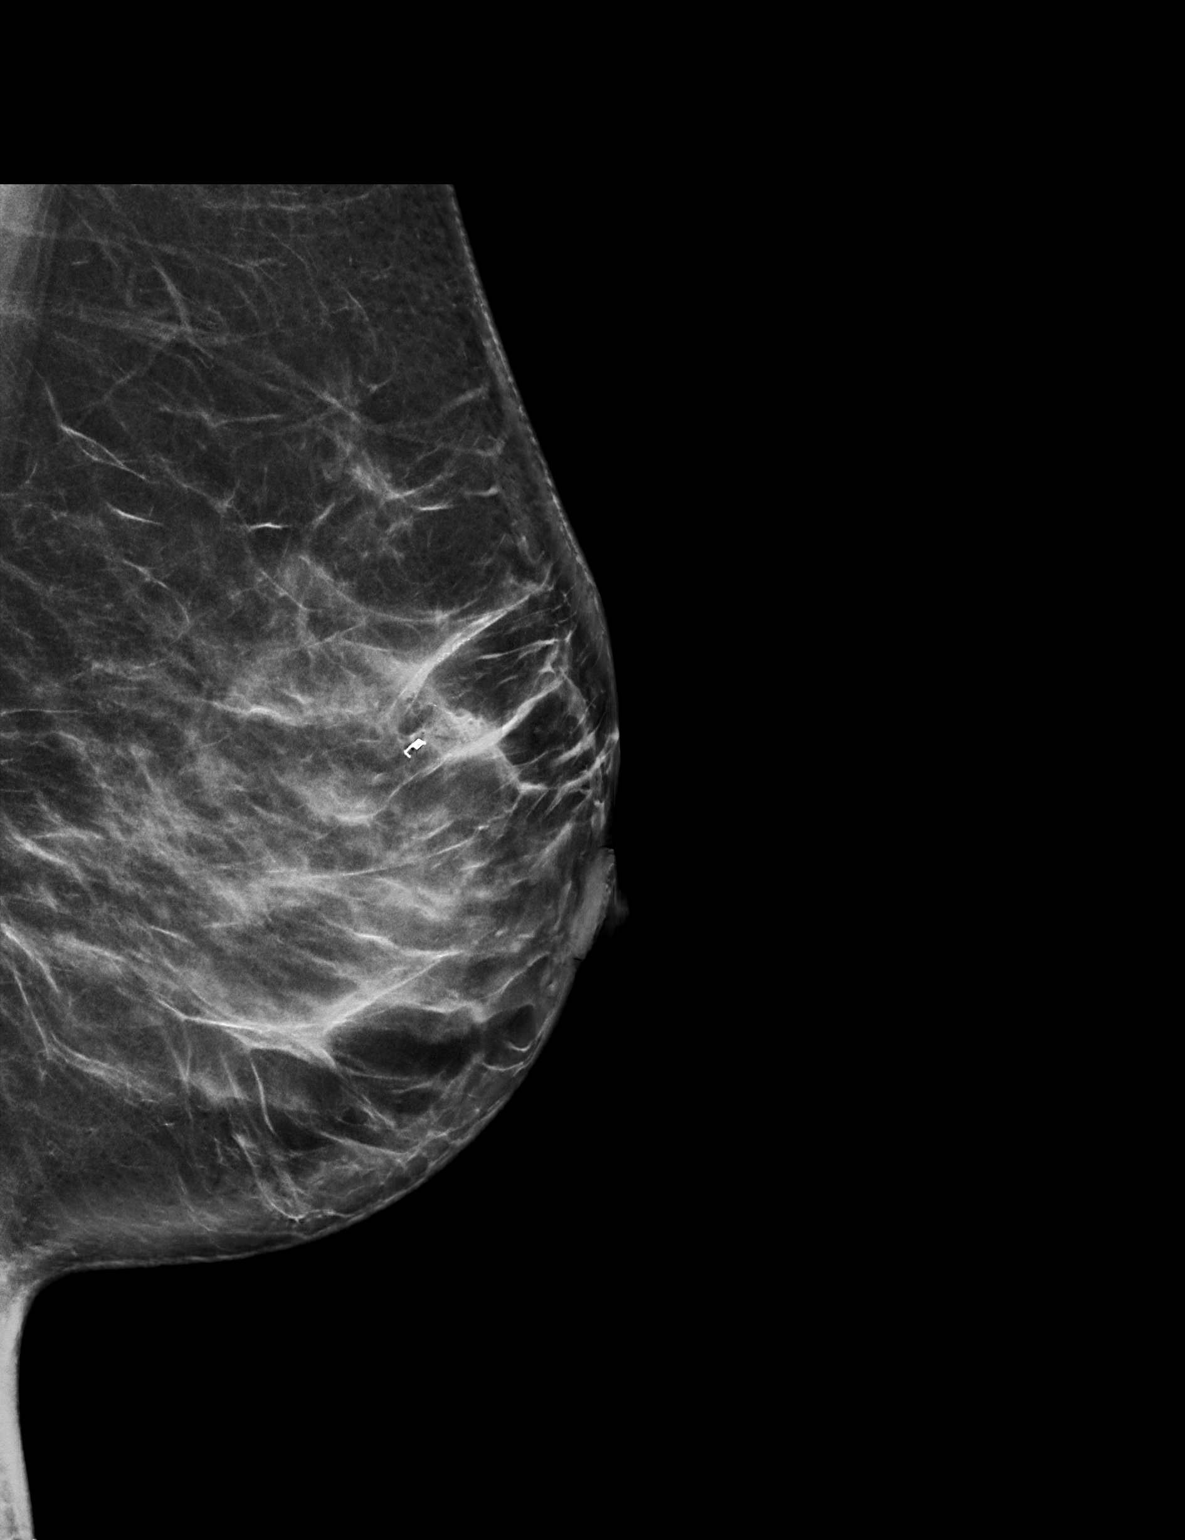

[L ML tomo · 2 of 72 frames shown]
[frame 24/72]
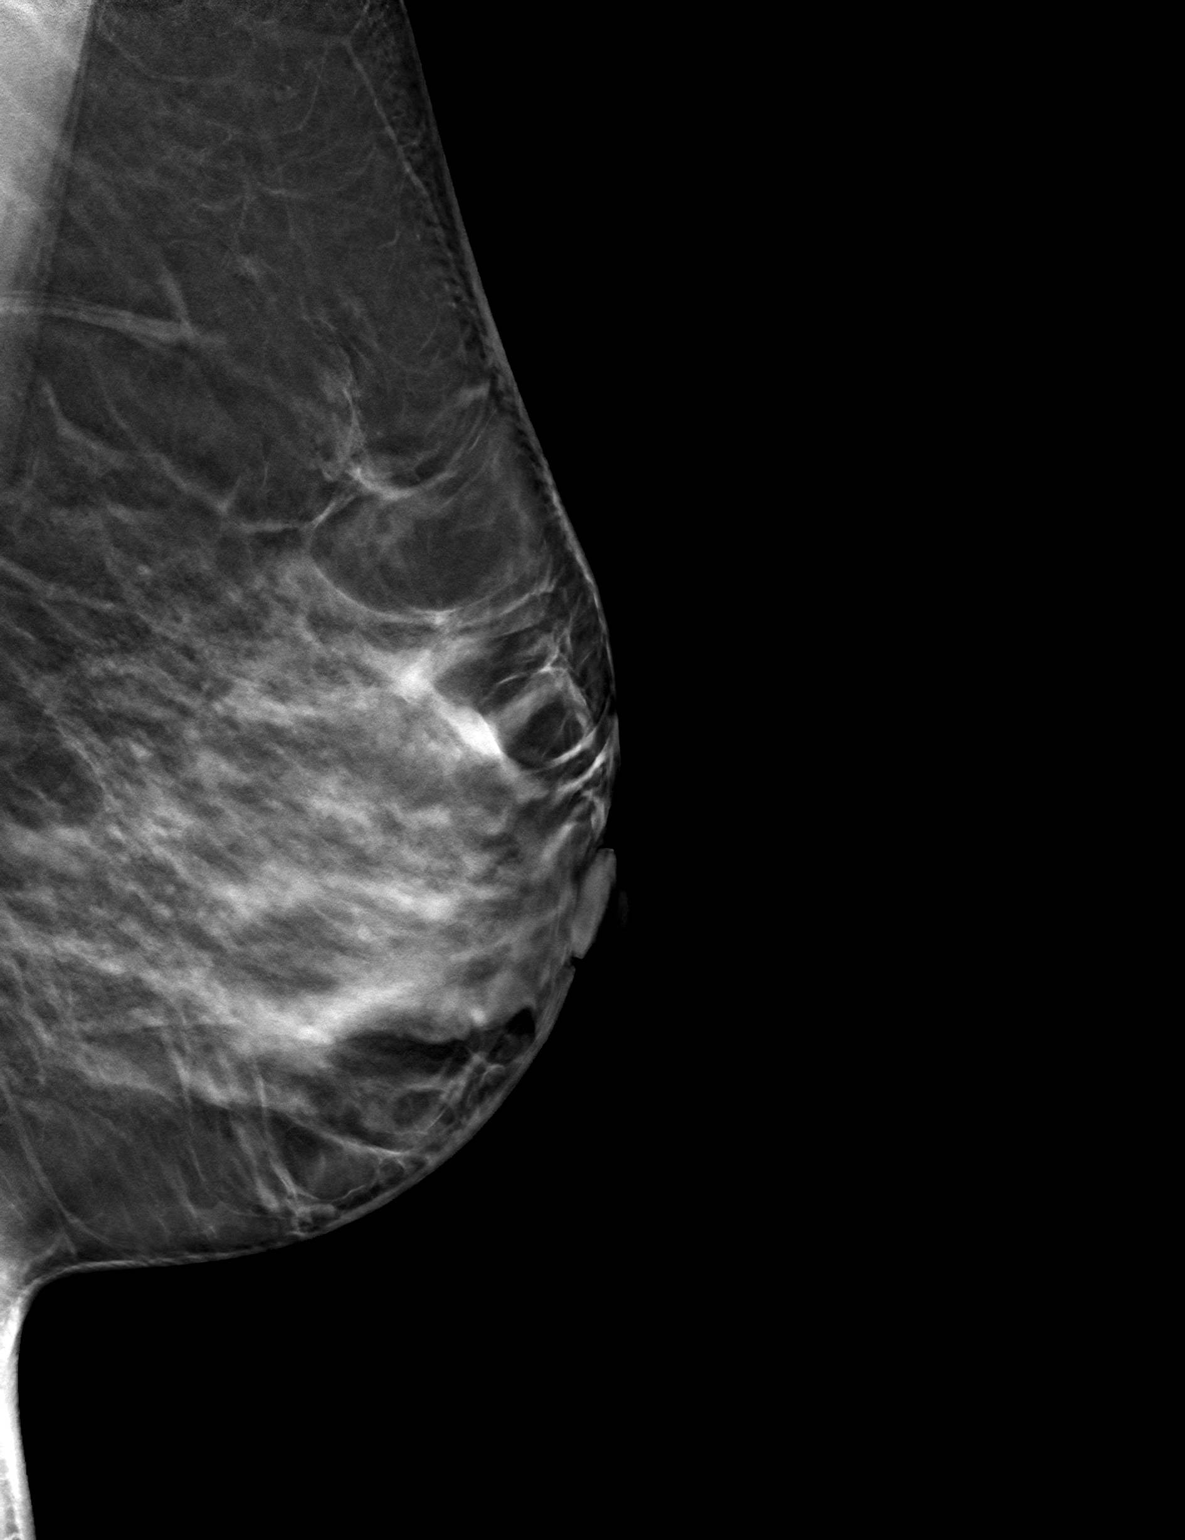
[frame 37/72]
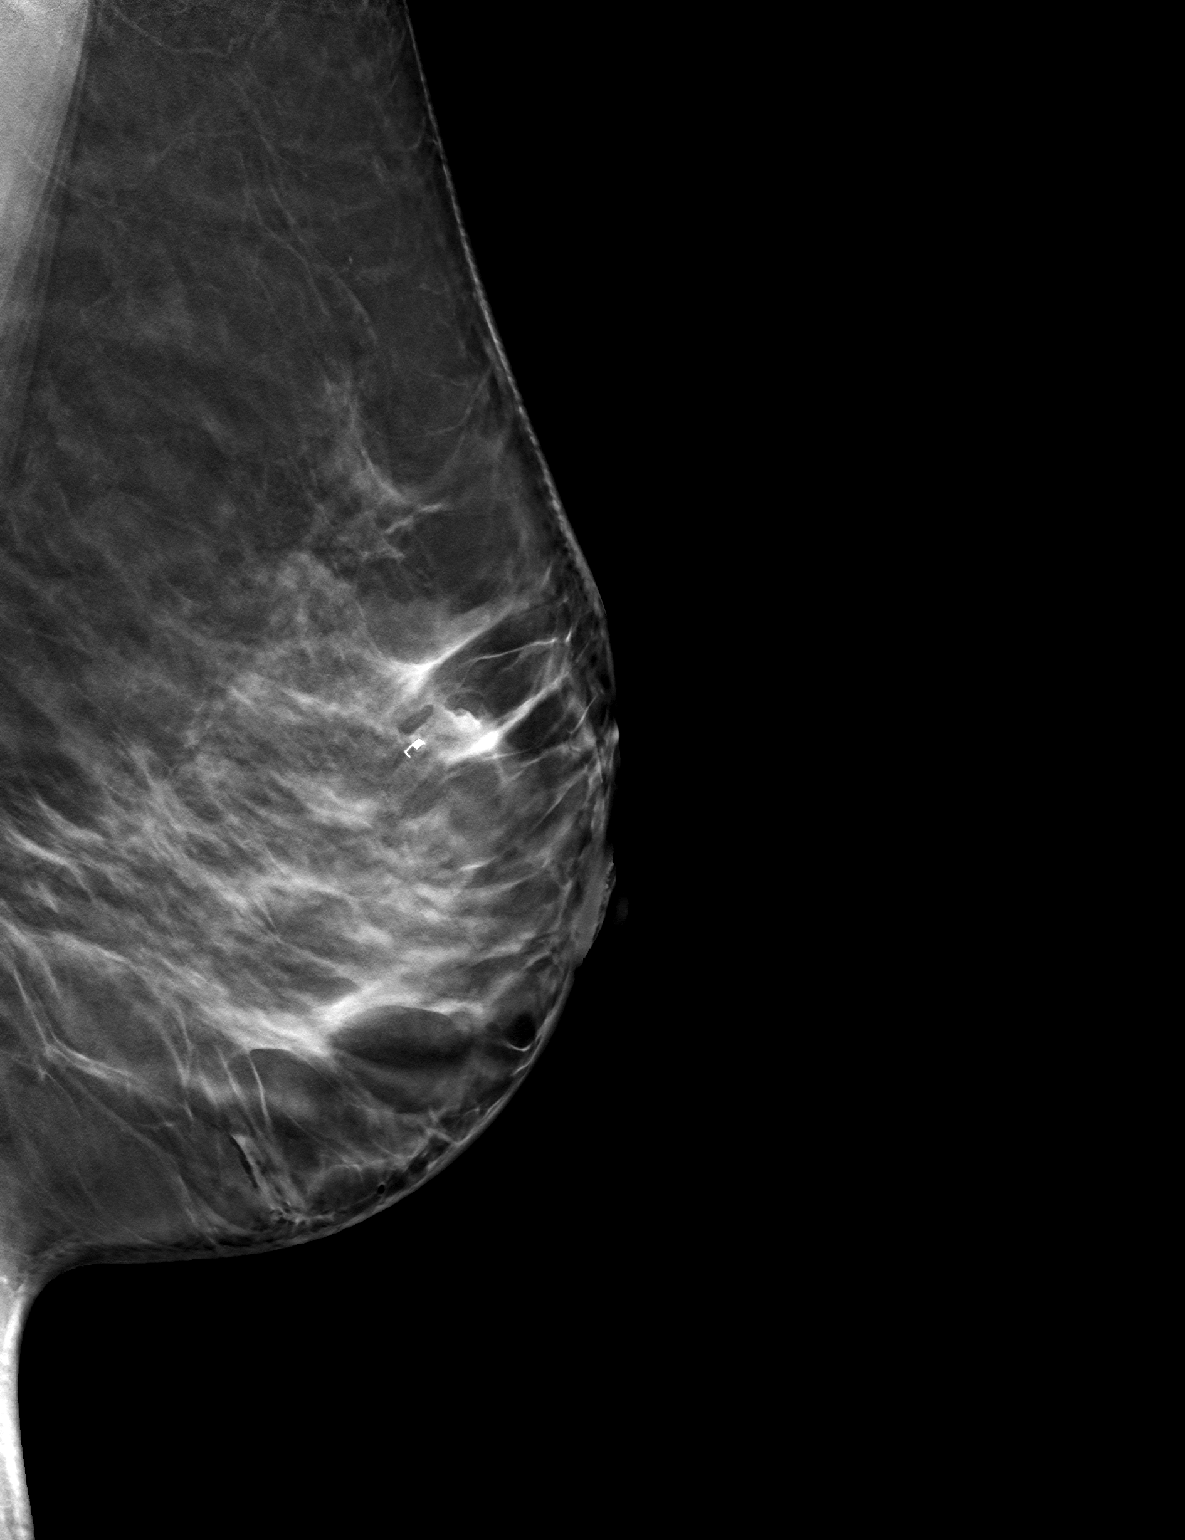

[3 of 6 positions shown; findings below may reference images not displayed]

FINDINGS: Mammographic images were obtained following stereotactic guided
biopsy of the recently demonstrated 8 mm group of calcifications in
the upper inner quadrant of the left breast. The biopsy marking clip
is in expected position at the site of biopsy. The targeted
calcifications are absent following biopsy and some of the
calcifications in the adjacent 3 mm group of calcifications may be
absent following biopsy as well.
IMPRESSION: Appropriate positioning of the coil shaped biopsy marking clip at
the site of biopsy in the upper inner quadrant of the left breast at
the site of biopsy of the 8 mm group of calcifications.

Final Assessment: Post Procedure Mammograms for Marker Placement

## 2022-04-20 ENCOUNTER — Other Ambulatory Visit: Payer: Self-pay | Admitting: Family Medicine

## 2022-04-20 DIAGNOSIS — Z1231 Encounter for screening mammogram for malignant neoplasm of breast: Secondary | ICD-10-CM

## 2022-04-29 ENCOUNTER — Ambulatory Visit
Admission: RE | Admit: 2022-04-29 | Discharge: 2022-04-29 | Disposition: A | Discharge status: Home / Self Care | Payer: No Typology Code available for payment source | Source: Ambulatory Visit | Attending: Family Medicine | Admitting: Family Medicine

## 2022-04-29 DIAGNOSIS — Z1231 Encounter for screening mammogram for malignant neoplasm of breast: Secondary | ICD-10-CM

## 2023-06-30 ENCOUNTER — Ambulatory Visit
Admission: RE | Admit: 2023-06-30 | Discharge: 2023-06-30 | Disposition: A | Discharge status: Home / Self Care | Source: Ambulatory Visit | Attending: Family Medicine | Admitting: Family Medicine

## 2023-06-30 ENCOUNTER — Other Ambulatory Visit: Payer: Self-pay | Admitting: Family Medicine

## 2023-06-30 DIAGNOSIS — Z1231 Encounter for screening mammogram for malignant neoplasm of breast: Secondary | ICD-10-CM

## 2023-07-05 ENCOUNTER — Other Ambulatory Visit: Payer: Self-pay | Admitting: Family Medicine

## 2023-07-05 DIAGNOSIS — R928 Other abnormal and inconclusive findings on diagnostic imaging of breast: Secondary | ICD-10-CM

## 2023-07-13 ENCOUNTER — Ambulatory Visit
Admission: RE | Admit: 2023-07-13 | Discharge: 2023-07-13 | Disposition: A | Discharge status: Home / Self Care | Source: Ambulatory Visit | Attending: Family Medicine | Admitting: Family Medicine

## 2023-07-13 DIAGNOSIS — R928 Other abnormal and inconclusive findings on diagnostic imaging of breast: Secondary | ICD-10-CM

## 2023-09-22 ENCOUNTER — Emergency Department (HOSPITAL_BASED_OUTPATIENT_CLINIC_OR_DEPARTMENT_OTHER): Admission: EM | Admit: 2023-09-22 | Discharge: 2023-09-22 | Disposition: A | Discharge status: Home / Self Care

## 2023-09-22 ENCOUNTER — Emergency Department (HOSPITAL_BASED_OUTPATIENT_CLINIC_OR_DEPARTMENT_OTHER)

## 2023-09-22 ENCOUNTER — Other Ambulatory Visit: Payer: Self-pay

## 2023-09-22 DIAGNOSIS — N179 Acute kidney failure, unspecified: Secondary | ICD-10-CM | POA: Diagnosis not present

## 2023-09-22 DIAGNOSIS — R002 Palpitations: Secondary | ICD-10-CM | POA: Diagnosis present

## 2023-09-22 DIAGNOSIS — I471 Supraventricular tachycardia, unspecified: Secondary | ICD-10-CM | POA: Diagnosis not present

## 2023-09-22 LAB — CBC WITH DIFFERENTIAL/PLATELET
Abs Immature Granulocytes: 0.11 K/uL — ABNORMAL HIGH (ref 0.00–0.07)
Basophils Absolute: 0.1 K/uL (ref 0.0–0.1)
Basophils Relative: 1 %
Eosinophils Absolute: 0 K/uL (ref 0.0–0.5)
Eosinophils Relative: 0 %
HCT: 40.9 % (ref 36.0–46.0)
Hemoglobin: 14.2 g/dL (ref 12.0–15.0)
Immature Granulocytes: 1 %
Lymphocytes Relative: 19 %
Lymphs Abs: 2.1 K/uL (ref 0.7–4.0)
MCH: 29.5 pg (ref 26.0–34.0)
MCHC: 34.7 g/dL (ref 30.0–36.0)
MCV: 84.9 fL (ref 80.0–100.0)
Monocytes Absolute: 0.5 K/uL (ref 0.1–1.0)
Monocytes Relative: 5 %
Neutro Abs: 8.3 K/uL — ABNORMAL HIGH (ref 1.7–7.7)
Neutrophils Relative %: 74 %
Platelets: 599 K/uL — ABNORMAL HIGH (ref 150–400)
RBC: 4.82 MIL/uL (ref 3.87–5.11)
RDW: 12.2 % (ref 11.5–15.5)
WBC: 11.1 K/uL — ABNORMAL HIGH (ref 4.0–10.5)
nRBC: 0 % (ref 0.0–0.2)

## 2023-09-22 LAB — COMPREHENSIVE METABOLIC PANEL WITH GFR
ALT: 21 U/L (ref 0–44)
AST: 21 U/L (ref 15–41)
Albumin: 4.5 g/dL (ref 3.5–5.0)
Alkaline Phosphatase: 81 U/L (ref 38–126)
Anion gap: 18 — ABNORMAL HIGH (ref 5–15)
BUN: 26 mg/dL — ABNORMAL HIGH (ref 6–20)
CO2: 21 mmol/L — ABNORMAL LOW (ref 22–32)
Calcium: 10.1 mg/dL (ref 8.9–10.3)
Chloride: 93 mmol/L — ABNORMAL LOW (ref 98–111)
Creatinine, Ser: 1.24 mg/dL — ABNORMAL HIGH (ref 0.44–1.00)
GFR, Estimated: 50 mL/min — ABNORMAL LOW (ref >60)
Glucose, Bld: 138 mg/dL — ABNORMAL HIGH (ref 70–99)
Potassium: 4.5 mmol/L (ref 3.5–5.1)
Sodium: 132 mmol/L — ABNORMAL LOW (ref 135–145)
Total Bilirubin: 0.5 mg/dL (ref 0.0–1.2)
Total Protein: 7.7 g/dL (ref 6.5–8.1)

## 2023-09-22 LAB — PHOSPHORUS: Phosphorus: 3.3 mg/dL (ref 2.5–4.6)

## 2023-09-22 LAB — MAGNESIUM: Magnesium: 2.3 mg/dL (ref 1.7–2.4)

## 2023-09-22 LAB — TSH: TSH: 0.846 u[IU]/mL (ref 0.350–4.500)

## 2023-09-22 MED ORDER — SODIUM CHLORIDE 0.9 % IV BOLUS
1000.0000 mL | Freq: Once | INTRAVENOUS | Status: AC
  Administered 2023-09-22: 1000 mL via INTRAVENOUS

## 2023-09-22 MED ORDER — ADENOSINE 6 MG/2ML IV SOLN
INTRAVENOUS | Status: AC
  Administered 2023-09-22: 6 mg
  Filled 2023-09-22: qty 6

## 2023-09-22 NOTE — ED Provider Notes (Signed)
 [place] EMERGENCY DEPARTMENT AT [facility] Provider Note   CSN: [id number] Arrival date & time: 09/22/23  1528     Patient presents with: Palpitations and Shortness of Breath   Beth Pearson is a 58 y.o. female.   HPI  Patient presents because of palpitations.  Patient states that she has been having palpitations what she thinks is SVT if for about an hour.  Patient states that she has a history of SVT in the past..  Currently not taking medications for this.  Used to be on metoprolol  but currently not taking.  Patient states that she been underneath a lot of stress here recently because of family illness as well as been having a viral illness with a cough for the past couple weeks.  Was recently given steroids.  No chest pain.  No shortness of breath.  She did feel like she may have some slight shortness of breath whenever her heart rate was going into the low 200s but otherwise has been asymptomatic here recently.  Denies any ACS history.  No nausea vomit diarrhea.  Otherwise, tolerating p.o.     Previous medical history reviewed : Last follow-up with cardiology back in September 2021.  History of SVT was noted in that time.   Prior to Admission medications   Medication Sig Start Date End Date Taking? Authorizing Provider  Biotin 5 MG CAPS Take by mouth daily.    [provider]  Cholecalciferol (VITAMIN D-3 PO) Take 1 capsule by mouth daily with breakfast.    [provider]  lisinopril-hydrochlorothiazide (ZESTORETIC) 20-25 MG tablet Take 1 tablet by mouth daily. 07/15/19   [provider]  Multiple Vitamins-Calcium (ONE-A-DAY WOMENS FORMULA) TABS Take 1 tablet by mouth daily with breakfast.    [provider]  Omega-3 Fatty Acids (FISH OIL PO) Take 1 capsule by mouth daily with breakfast.    [provider]  propranolol (INDERAL) 10 MG tablet Take 10 mg by mouth 2 (two) times daily. 09/03/19   [provider]   VYVANSE 40 MG capsule Take 40 mg by mouth every morning.  02/21/19   [provider]    Allergies: Penicillins    Review of Systems  Constitutional:  Negative for chills and fever.  HENT:  Negative for ear pain and sore throat.   Eyes:  Negative for pain and visual disturbance.  Respiratory:  Negative for cough and shortness of breath.   Cardiovascular:  Negative for chest pain and palpitations.  Gastrointestinal:  Negative for abdominal pain and vomiting.  Genitourinary:  Negative for dysuria and hematuria.  Musculoskeletal:  Negative for arthralgias and back pain.  Skin:  Negative for color change and rash.  Neurological:  Negative for seizures and syncope.  All other systems reviewed and are negative.   Updated Vital Signs BP 108/76   Pulse 92   Temp 98.1 F (36.7 C) (Oral)   Resp 16   Ht 5' 2 (1.575 m)   Wt 69.4 kg   SpO2 100%   BMI 27.98 kg/m   Physical Exam Vitals and nursing note reviewed.  Constitutional:      General: She is not in acute distress.    Appearance: She is well-developed.  HENT:     Head: Normocephalic and atraumatic.  Eyes:     Conjunctiva/sclera: Conjunctivae normal.  Cardiovascular:     Rate and Rhythm: Normal rate and regular rhythm.     Heart sounds: No murmur heard. Pulmonary:  Effort: Pulmonary effort is normal. No respiratory distress.     Breath sounds: Normal breath sounds.  Abdominal:     Palpations: Abdomen is soft.     Tenderness: There is no abdominal tenderness.  Musculoskeletal:        General: No swelling.     Cervical back: Neck supple.  Skin:    General: Skin is warm and dry.     Capillary Refill: Capillary refill takes less than 2 seconds.  Neurological:     Mental Status: She is alert.  Psychiatric:        Mood and Affect: Mood normal.     (all labs ordered are listed, but only abnormal results are displayed) Labs Reviewed  COMPREHENSIVE METABOLIC PANEL WITH GFR - Abnormal; Notable for the  following components:      Result Value   Sodium 132 (*)    Chloride 93 (*)    CO2 21 (*)    Glucose, Bld 138 (*)    BUN 26 (*)    Creatinine, Ser 1.24 (*)    GFR, Estimated 50 (*)    Anion gap 18 (*)    All other components within normal limits  CBC WITH DIFFERENTIAL/PLATELET - Abnormal; Notable for the following components:   WBC 11.1 (*)    Platelets 599 (*)    Neutro Abs 8.3 (*)    Abs Immature Granulocytes 0.11 (*)    All other components within normal limits  MAGNESIUM  TSH  PHOSPHORUS    EKG: EKG Interpretation Date/Time:  Friday September 22 2023 15:55:13 EDT Ventricular Rate:  98 PR Interval:  158 QRS Duration:  80 QT Interval:  347 QTC Calculation: 443 R Axis:   85  Text Interpretation: Sinus rhythm no changes compared to pior Confirmed by Simon Rea [phone]) on 09/22/2023 4:02:24 PM  Radiology: DG Chest Portable 1 View Result Date: 09/22/2023 CLINICAL DATA:  Episodes of SVT today; eval for pneumonia EXAM: PORTABLE CHEST - 1 VIEW COMPARISON:  None available. FINDINGS: No focal airspace consolidation, pleural effusion, or pneumothorax. No cardiomegaly.No acute fracture or destructive lesion. IMPRESSION: No acute cardiopulmonary abnormality. Electronically Signed   By: Rogelia Myers M.D.   On: 09/22/2023 17:11     Procedures   Medications Ordered in the ED  adenosine  (ADENOCARD ) 6 MG/2ML injection (6 mg  Given 09/22/23 1554)  sodium chloride  0.9 % bolus 1,000 mL (0 mLs Intravenous Stopped 09/22/23 1653)  sodium chloride  0.9 % bolus 1,000 mL (0 mLs Intravenous Stopped 09/22/23 1758)                                    Medical Decision Making Amount and/or Complexity of Data Reviewed Labs: ordered. Radiology: ordered.   Patient presents because of palpitations.  Patient states that she has been having palpitations what she thinks is SVT if for about an hour.  Patient states that she has a history of SVT in the past..  Currently not taking medications for this.   Used to be on metoprolol  but currently not taking.  Patient states that she been underneath a lot of stress here recently because of family illness as well as been having a viral illness with a cough for the past couple weeks.  Was recently given steroids.  No chest pain.  No shortness of breath.  She did feel like she may have some slight shortness of breath whenever her heart rate was  going into the low 200s but otherwise has been asymptomatic here recently.  Denies any ACS history.  No nausea vomit diarrhea.  Otherwise, tolerating p.o.     Previous medical history reviewed : Last follow-up with cardiology back in September 2021.  History of SVT was noted in that time.   Presented because of palpitations.  Upon EKG interpretation, patient was noted to be in SVT.  Rate about 200.  Maybe some slight diffuse ST depressions probably related to demand ischemia.  No ACS history.  Patient rate in 200s when I first walked in the room.  Maps appropriate.  Mentating well.  O2 saturation 98% on room air.  Slight tachypnea.  Tried vagal maneuvers including patient blowing through a syringe and subsequently flipping upside down with the legs in the air x 4.  Did not work.  Subsequently cardioverted with 6 mg of adenosine   Patient remained hemodynamically stable.  After patient converted back to sinus rhythm, she stayed in sinus rhythm.  Maps remained appropriate.  On room air upper 90s O2 saturation.    obtain laboratory workup.  Potassium 4.5.  Magnesium 2.3.  Phos of 3.3.  No significant electrolyte derangements.  Patient does have a small AKI.  Sodium decreased to 132 as well.  I think this is likely because of decreased p.o. intake.  Patient said a lot of stress here recently because of a family illness.  Subsequent is not been eating or drinking much over the past couple of days.  I think this probably contributed to patient's presentation today.  Small leukocytosis on labs.  Likely reactive in nature.   Otherwise, unremarkable labs.  No concerns indicating ACS process.  No indication to obtain troponin.  Chest pain-free.  EKG showed sinus rhythm after cardioversion.  No concerns for STEMI or other ACS pathology this point time.  No concerns for PE.  No risk factors for this.   Patient to follow-up with cardiology to discuss whether or not she needs to restart propranolol   CRITICAL CARE Performed by: Lavonia LOISE Pat   Total critical care time: 35 minutes  Critical care time was exclusive of separately billable procedures and treating other patients.  Critical care was necessary to treat or prevent imminent or life-threatening deterioration.  Critical care was time spent personally by me on the following activities: development of treatment plan with patient and/or surrogate as well as nursing, discussions with consultants, evaluation of patient's response to treatment, examination of patient, obtaining history from patient or surrogate, ordering and performing treatments and interventions, ordering and review of laboratory studies, ordering and review of radiographic studies, pulse oximetry and re-evaluation of patient's condition.     Final diagnoses:  SVT (supraventricular tachycardia) (HCC)  AKI (acute kidney injury) [facility])    ED Discharge Orders     None          Pat Lavonia LOISE, MD 09/22/23 2035

## 2023-09-22 NOTE — ED Triage Notes (Signed)
 Pt POV reporting palpitations and SOB since 1440, HR 200s, hx SVT, multiple conversions in the past.

## 2023-09-22 NOTE — ED Notes (Signed)
 ED Provider at bedside.

## 2023-09-22 NOTE — Discharge Instructions (Addendum)
 Please follow-up with your cardiologist regarding your episode of SVT.  Your kidney numbers are slightly up.  Your sodium levels were slightly down as well.  He had mentioned that he had never been eating or drinking over the past couple days because of stress.  Please take care of yourself.  Please try to drink and eat plenty.  I do want you to have repeat labs next week to make sure your kidney function is returned back to normal.  Also want to make sure your sodium levels returned back to normal.  If you notice any kind of palpitations will not go away for longer than 15 to 20 minutes then please come back to the ED.  If experiencing a chest pain or shortness of breath please come back to ED.

## 2023-09-22 NOTE — ED Notes (Signed)
 Vagal maneuvers attempted x5 with no succcess

## 2023-09-22 NOTE — ED Notes (Signed)
 Pt alert and oriented X 4 at the time of discharge. RR even and unlabored. No acute distress noted. Pt verbalized understanding of discharge instructions as discussed. Pt ambulatory to lobby at time of discharge.

## 2023-12-07 ENCOUNTER — Ambulatory Visit (HOSPITAL_BASED_OUTPATIENT_CLINIC_OR_DEPARTMENT_OTHER): Admitting: Cardiology

## 2024-01-04 ENCOUNTER — Encounter (HOSPITAL_BASED_OUTPATIENT_CLINIC_OR_DEPARTMENT_OTHER): Payer: Self-pay

## 2024-01-05 ENCOUNTER — Ambulatory Visit (HOSPITAL_BASED_OUTPATIENT_CLINIC_OR_DEPARTMENT_OTHER): Admitting: Cardiology

## 2024-01-05 ENCOUNTER — Ambulatory Visit: Attending: Cardiology

## 2024-01-05 VITALS — BP 134/80 | HR 66 | Ht 62.0 in | Wt 153.5 lb

## 2024-01-05 DIAGNOSIS — I471 Supraventricular tachycardia, unspecified: Secondary | ICD-10-CM

## 2024-01-05 DIAGNOSIS — Z712 Person consulting for explanation of examination or test findings: Secondary | ICD-10-CM

## 2024-01-05 DIAGNOSIS — Z7189 Other specified counseling: Secondary | ICD-10-CM

## 2024-01-05 DIAGNOSIS — I10 Essential (primary) hypertension: Secondary | ICD-10-CM

## 2024-01-05 MED ORDER — METOPROLOL TARTRATE 25 MG PO TABS: 25.0000 mg | ORAL_TABLET | ORAL | 11 refills | Status: AC | PRN

## 2024-01-05 NOTE — Progress Notes (Signed)
 " Cardiology Office Note:  .   Date:  01/05/2024  ID:  Beth Pearson, DOB 1965/04/23, MRN [id number] PCP: Claudene Pellet, MD  [facility] Providers Cardiologist:  Shelda Bruckner, MD {  History of Present Illness: Beth   Tayten Heber Pearson is a 58 y.o. female with PMH paroxysmal SVT. She was remotely seen by Dr. Shlomo in 2021 but has not been seen since in cardiology. She is seen for new patient consultation at the request of Dr. Claudene for paroxysmal SVT.  Reviewed referral from 09/27/23 for paroxysmal SVT from Dr. Claudene. Note reviewed under media.  First episode of SVT was 24 years ago, required conversion. Episodes have occurred with high stress. Most recently one was 09/22/23 when she has stressed about her grandchild's medical condition (on heart transplant list at [facility]). Came to ER after an hour of symptoms, found to be in SVT around 200 bpm. Tried vagal maneuvers without change. Converted with 6 mg adenosine .  She has not had another prolonged episode but does have frequent fluttering. She isn't sure how frequent these are  Was previously offered an ablation, but she was concerned about risk of needed a pacemaker.   Vyvanse is very necessary, wants to keep if at all possible. Has cut back on her caffeine. No alcohol. No routine activity with the family stress. She is trying to cook all the food while they are temporarily living in [place] waiting for a heart transplant for her grandchild.  FH: father with afib, PVCs. Grandchild with congenital heart disease.  ROS: Denies chest pain, shortness of breath at rest or with normal exertion. No PND, orthopnea, LE edema or unexpected weight gain. No syncope or palpitations. ROS otherwise negative except as noted.   Studies Reviewed: Beth    EKG:       Physical Exam:   VS:  BP 134/80 (BP Location: Right Arm, Patient Position: Sitting, Cuff Size: Normal)   Pulse 66   Ht 5' 2 (1.575 m)   Wt 153 lb 8 oz (69.6 kg)   SpO2 99%   BMI 28.08  kg/m    Wt Readings from Last 3 Encounters:  01/05/24 153 lb 8 oz (69.6 kg)  09/22/23 153 lb (69.4 kg)  09/19/19 182 lb (82.6 kg)    GEN: Well nourished, well developed in no acute distress HEENT: Normal, moist mucous membranes NECK: No JVD CARDIAC: regular rhythm, normal S1 and S2, no rubs or gallops. No murmur. VASCULAR: Radial and DP pulses 2+ bilaterally. No carotid bruits RESPIRATORY:  Clear to auscultation without rales, wheezing or rhonchi  ABDOMEN: Soft, non-tender, non-distended MUSCULOSKELETAL:  Ambulates independently SKIN: Warm and dry, no edema NEUROLOGIC:  Alert and oriented x 3. No focal neuro deficits noted. PSYCHIATRIC:  Normal affect    ASSESSMENT AND PLAN: .    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 If we refer to EP for ablation, or other arrhthmias found, will recheck echo -discussed medical management, ablation based on results -has never taken any medication for this. At one time she had a PRN metoprolol  script but never took it.  -we will give PRN metoprolol  while waiting for monitor.  With her resting HR in the 60s, want to make sure she doesn't go too bradycardic if she converts, will put a limit on dosing -reviewed vagal maneuvers -reviewed red flag signs that need immediate medical attention  Hypertension -on lisinopril-hctz  CV risk counseling and prevention -recommend heart healthy/Mediterranean diet, with whole grains, fruits, vegetable, fish, lean meats, nuts, and olive oil. Limit salt. -recommend moderate walking, 3-5 times/week for 30-50 minutes each session. Aim for at least 150 minutes/week.  Goal should be pace of 3 miles/hours, or walking 1.5 miles in 30 minutes -recommend avoidance of tobacco products. Avoid excess alcohol.  Dispo: we will follow up on results of monitor and make adjustments. If symptoms well controlled/no prolonged SVT, follow up in 6 mos  Signed, Shelda Bruckner, MD   Shelda Bruckner, MD, PhD, [facility]  [facility]  [place]  [facility] at [facility] at [facility] [address], [address] [place], [place] [address] [phone]   "

## 2024-01-05 NOTE — Progress Notes (Unsigned)
 Enrolled patient for a 14 day Zio XT  monitor to be mailed to patients home

## 2024-01-05 NOTE — Patient Instructions (Signed)
 Medication Instructions:  1.) start metoprolol  tartrate 25 mg - one tablet every 2 (two) hours as needed (for SVT/elevated heart rate, maximum 4 doses/day)   *If you need a refill on your cardiac medications before your next appointment, please call your pharmacy*  Lab Work: none   Testing/Procedures: 2 week Zio Monitor - see instructions below  Follow-Up: At [facility], you and your health needs are our priority.  As part of our continuing mission to provide you with exceptional heart care, our providers are all part of one team.  This team includes your primary Cardiologist (physician) and Advanced Practice Providers or APPs (Physician Assistants and Nurse Practitioners) who all work together to provide you with the care you need, when you need it.  Your next appointment:   6 month(s)  Provider:   Shelda Bruckner, MD, Rosaline Bane, NP, or Reche Finder, NP    Other Instructions GEOFFRY HEWS- Long Term Monitor Instructions  Your physician has requested you wear a ZIO patch monitor for 14 days.  This is a single patch monitor. [employer] supplies one patch monitor per enrollment. Additional stickers are not available. Please do not apply patch if you will be having a Nuclear Stress Test,  Echocardiogram, Cardiac CT, MRI, or Chest Xray during the period you would be wearing the  monitor. The patch cannot be worn during these tests. You cannot remove and re-apply the  ZIO XT patch monitor.  Your ZIO patch monitor will be mailed 3 day USPS to your address on file. It may take 3-5 days  to receive your monitor after you have been enrolled.  Once you have received your monitor, please review the enclosed instructions. Your monitor  has already been registered assigning a specific monitor serial # to you.  Billing and Patient Assistance Program Information  We have supplied [employer] with any of your insurance information on file for billing purposes. [employer] offers a  sliding scale Patient Assistance Program for patients that do not have  insurance, or whose insurance does not completely cover the cost of the ZIO monitor.  You must apply for the Patient Assistance Program to qualify for this discounted rate.  To apply, please call [employer] at [phone], select option 4, select option 2, ask to apply for  Patient Assistance Program. Meredeth will ask your household income, and how many people  are in your household. They will quote your out-of-pocket cost based on that information.  [employer] will also be able to set up a 27-month, interest-free payment plan if needed.  Applying the monitor Hold abrader disc by orange tab. Rub abrader in 40 strokes over the upper left chest as  indicated in your monitor instructions.  Clean area with 4 enclosed alcohol pads. Let dry.  Apply patch as indicated in monitor instructions. Patch will be placed under collarbone on left  side of chest with arrow pointing upward.  Rub patch adhesive wings for 2 minutes. Remove white label marked 1. Remove the white  label marked 2. Rub patch adhesive wings for 2 additional minutes.  While looking in a mirror, press and release button in center of patch. A small green light will  flash 3-4 times. This will be your only indicator that the monitor has been turned on.  Do not shower for the first 24 hours. You may shower after the first 24 hours.  Press the button if you feel a symptom. You will hear a small click. Record Date, Time and  Symptom in  the Patient Logbook.  When you are ready to remove the patch, follow instructions on the last 2 pages of Patient  Logbook. Stick patch monitor onto the last page of Patient Logbook.  Place Patient Logbook in the blue and white box. Use locking tab on box and tape box closed  securely. The blue and white box has prepaid postage on it. Please place it in the mailbox as  soon as possible. Your physician should have your test results  approximately 7 days after the  monitor has been mailed back to [facility].  Call [facility] Customer Care at [phone] if you have questions regarding  your ZIO XT patch monitor. Call them immediately if you see an orange light blinking on your  monitor.  If your monitor falls off in less than 4 days, contact our Monitor department at [phone].  If your monitor becomes loose or falls off after 4 days call [employer] at [phone] for  suggestions on securing your monitor

## 2024-02-05 ENCOUNTER — Ambulatory Visit (HOSPITAL_BASED_OUTPATIENT_CLINIC_OR_DEPARTMENT_OTHER): Payer: Self-pay | Admitting: Cardiology

## 2024-02-05 DIAGNOSIS — I471 Supraventricular tachycardia, unspecified: Secondary | ICD-10-CM | POA: Diagnosis not present
